# Patient Record
Sex: Male | Born: 1965 | Race: Black or African American | Hispanic: No
Health system: Southern US, Community
[De-identification: ages and names within clinical notes are randomized; demographics above are authoritative.]

---

## 2019-08-04 ENCOUNTER — Other Ambulatory Visit: Payer: Self-pay

## 2019-08-04 ENCOUNTER — Emergency Department (HOSPITAL_COMMUNITY)
Admission: EM | Admit: 2019-08-04 | Discharge: 2019-08-04 | Disposition: A | Discharge status: Home / Self Care | Payer: Medicaid Other | Attending: Emergency Medicine | Admitting: Emergency Medicine

## 2019-08-04 ENCOUNTER — Emergency Department (HOSPITAL_COMMUNITY): Payer: Medicaid Other

## 2019-08-04 ENCOUNTER — Encounter (HOSPITAL_COMMUNITY): Payer: Self-pay | Admitting: Emergency Medicine

## 2019-08-04 DIAGNOSIS — Z7984 Long term (current) use of oral hypoglycemic drugs: Secondary | ICD-10-CM | POA: Insufficient documentation

## 2019-08-04 DIAGNOSIS — I10 Essential (primary) hypertension: Secondary | ICD-10-CM | POA: Insufficient documentation

## 2019-08-04 DIAGNOSIS — Z79899 Other long term (current) drug therapy: Secondary | ICD-10-CM | POA: Insufficient documentation

## 2019-08-04 DIAGNOSIS — R42 Dizziness and giddiness: Secondary | ICD-10-CM

## 2019-08-04 DIAGNOSIS — R4789 Other speech disturbances: Secondary | ICD-10-CM | POA: Insufficient documentation

## 2019-08-04 DIAGNOSIS — R531 Weakness: Secondary | ICD-10-CM | POA: Insufficient documentation

## 2019-08-04 DIAGNOSIS — R519 Headache, unspecified: Secondary | ICD-10-CM | POA: Insufficient documentation

## 2019-08-04 DIAGNOSIS — E119 Type 2 diabetes mellitus without complications: Secondary | ICD-10-CM | POA: Insufficient documentation

## 2019-08-04 LAB — COMPREHENSIVE METABOLIC PANEL
ALT: 23 U/L (ref 0–44)
AST: 24 U/L (ref 15–41)
Albumin: 3.9 g/dL (ref 3.5–5.0)
Alkaline Phosphatase: 50 U/L (ref 38–126)
Anion gap: 8 (ref 5–15)
BUN: 10 mg/dL (ref 6–20)
CO2: 24 mmol/L (ref 22–32)
Calcium: 9.5 mg/dL (ref 8.9–10.3)
Chloride: 109 mmol/L (ref 98–111)
Creatinine, Ser: 0.8 mg/dL (ref 0.61–1.24)
GFR calc Af Amer: >60 mL/min (ref >60)
GFR calc non Af Amer: >60 mL/min (ref >60)
Glucose, Bld: 71 mg/dL (ref 70–99)
Potassium: 3.8 mmol/L (ref 3.5–5.1)
Sodium: 141 mmol/L (ref 135–145)
Total Bilirubin: 0.8 mg/dL (ref 0.3–1.2)
Total Protein: 7.7 g/dL (ref 6.5–8.1)

## 2019-08-04 LAB — CBC
HCT: 44.9 % (ref 39.0–52.0)
Hemoglobin: 15.3 g/dL (ref 13.0–17.0)
MCH: 32.8 pg (ref 26.0–34.0)
MCHC: 34.1 g/dL (ref 30.0–36.0)
MCV: 96.4 fL (ref 80.0–100.0)
Platelets: 284 10*3/uL (ref 150–400)
RBC: 4.66 MIL/uL (ref 4.22–5.81)
RDW: 12.4 % (ref 11.5–15.5)
WBC: 9.9 10*3/uL (ref 4.0–10.5)
nRBC: 0 % (ref 0.0–0.2)

## 2019-08-04 LAB — URINALYSIS, ROUTINE W REFLEX MICROSCOPIC
Bilirubin Urine: NEGATIVE
Glucose, UA: NEGATIVE mg/dL
Hgb urine dipstick: NEGATIVE
Ketones, ur: 5 mg/dL — AB
Leukocytes,Ua: NEGATIVE
Nitrite: NEGATIVE
Protein, ur: NEGATIVE mg/dL
Specific Gravity, Urine: 1.032 — ABNORMAL HIGH (ref 1.005–1.030)
pH: 5 (ref 5.0–8.0)

## 2019-08-04 LAB — DIFFERENTIAL
Abs Immature Granulocytes: 0.02 10*3/uL (ref 0.00–0.07)
Basophils Absolute: 0.1 10*3/uL (ref 0.0–0.1)
Basophils Relative: 1 %
Eosinophils Absolute: 0.2 10*3/uL (ref 0.0–0.5)
Eosinophils Relative: 2 %
Immature Granulocytes: 0 %
Lymphocytes Relative: 37 %
Lymphs Abs: 3.6 10*3/uL (ref 0.7–4.0)
Monocytes Absolute: 0.7 10*3/uL (ref 0.1–1.0)
Monocytes Relative: 7 %
Neutro Abs: 5.3 10*3/uL (ref 1.7–7.7)
Neutrophils Relative %: 53 %

## 2019-08-04 LAB — PROTIME-INR
INR: 1 (ref 0.8–1.2)
Prothrombin Time: 12.7 seconds (ref 11.4–15.2)

## 2019-08-04 LAB — I-STAT CHEM 8, ED
BUN: 10 mg/dL (ref 6–20)
Calcium, Ion: 1.08 mmol/L — ABNORMAL LOW (ref 1.15–1.40)
Chloride: 109 mmol/L (ref 98–111)
Creatinine, Ser: 0.8 mg/dL (ref 0.61–1.24)
Glucose, Bld: 68 mg/dL — ABNORMAL LOW (ref 70–99)
HCT: 46 % (ref 39.0–52.0)
Hemoglobin: 15.6 g/dL (ref 13.0–17.0)
Potassium: 3.8 mmol/L (ref 3.5–5.1)
Sodium: 140 mmol/L (ref 135–145)
TCO2: 22 mmol/L (ref 22–32)

## 2019-08-04 LAB — RAPID URINE DRUG SCREEN, HOSP PERFORMED
Amphetamines: NOT DETECTED
Barbiturates: NOT DETECTED
Benzodiazepines: NOT DETECTED
Cocaine: NOT DETECTED
Opiates: NOT DETECTED
Tetrahydrocannabinol: NOT DETECTED

## 2019-08-04 LAB — CBG MONITORING, ED: Glucose-Capillary: 114 mg/dL — ABNORMAL HIGH (ref 70–99)

## 2019-08-04 LAB — ETHANOL: Alcohol, Ethyl (B): <10 mg/dL (ref <10)

## 2019-08-04 LAB — APTT: aPTT: 32 seconds (ref 24–36)

## 2019-08-04 MED ORDER — KETOROLAC TROMETHAMINE 15 MG/ML IJ SOLN
15.0000 mg | Freq: Once | INTRAMUSCULAR | Status: AC
  Administered 2019-08-04: 16:00:00 15 mg via INTRAVENOUS
  Filled 2019-08-04: qty 1

## 2019-08-04 MED ORDER — SODIUM CHLORIDE 0.9 % IV BOLUS
1000.0000 mL | Freq: Once | INTRAVENOUS | Status: AC
  Administered 2019-08-04: 1000 mL via INTRAVENOUS

## 2019-08-04 MED ORDER — PROCHLORPERAZINE EDISYLATE 10 MG/2ML IJ SOLN
10.0000 mg | Freq: Once | INTRAMUSCULAR | Status: AC
  Administered 2019-08-04: 10 mg via INTRAVENOUS
  Filled 2019-08-04: qty 2

## 2019-08-04 MED ORDER — DIPHENHYDRAMINE HCL 50 MG/ML IJ SOLN
50.0000 mg | Freq: Once | INTRAMUSCULAR | Status: AC
  Administered 2019-08-04: 50 mg via INTRAVENOUS
  Filled 2019-08-04: qty 1

## 2019-08-04 NOTE — ED Provider Notes (Signed)
[facility]-EMERGENCY DEPT Provider Note   CSN: [id number] Arrival date & time: 08/04/19  1409     History Chief Complaint  Patient presents with  . Dizziness  . Headache    Chrishawn Boley is a 54 y.o. male who presents to the ED today with complaint of sudden onset lightheadedness with "shaking" that began this morning around 11:30 AM. Pt reports he was at work when this occurred; he attempted to put his head down on his desk and when he picked his head back up he had a sudden onset headache. Pt states that he was able to walk to his boss' office who made him sit down. He reports feeling like he was stuttering/slurring his words and felt heaviness on his left side. Pt states this lasted 20-30 minutes before dissipating. He reports he still has the headache. He reports he was not sure if his sugars were low; he took his medications this morning including his lisinopril and metformin however did not eat anything prior to the episode occurring. Someone from pt's sober house came to pick him up and brought him to the ED. Pt was able to walk from the office into the car and then again from the car into the ED and to his bedroom without difficulty. He reports a similar episode approximately 20 years ago for which he was evaluated in [place] however he is unsure if they found anything from the work up. 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   The history is provided by the patient and medical records.       History reviewed. No pertinent past medical history.  There are no problems to display for this patient.   History reviewed. No pertinent surgical history.     No family history on file.  Social History   Tobacco Use  . Smoking status: Not on file  Substance Use Topics  . Alcohol use: Not on file  . Drug use: Not on file    Home Medications Prior to Admission medications     Medication Sig Start Date End Date Taking? Authorizing Provider  lisinopril (ZESTRIL) 10 MG tablet Take 10 mg by mouth daily.   Yes [provider]  metFORMIN (GLUCOPHAGE) 500 MG tablet Take 500 mg by mouth 2 (two) times daily with a meal.   Yes [provider]    Allergies    Ibuprofen  Review of Systems   Review of Systems  Constitutional: Negative for chills and fever.  Respiratory: Negative for cough and shortness of breath.   Cardiovascular: Negative for chest pain.  Gastrointestinal: Negative for abdominal pain, nausea and vomiting.  Neurological: Positive for speech difficulty, weakness, light-headedness and headaches. Negative for syncope.  All other systems reviewed and are negative.   Physical Exam Updated Vital Signs BP 138/89   Pulse 88   Temp 98.5 F (36.9 C) (Oral)   Resp 17   SpO2 98%   Physical Exam Vitals and nursing note reviewed.  Constitutional:      Appearance: He is not ill-appearing or diaphoretic.  HENT:     Head: Normocephalic and atraumatic.  Eyes:     Conjunctiva/sclera: Conjunctivae normal.     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  Cardiovascular:     Rate and Rhythm: Normal rate and regular  rhythm.     Heart sounds: Normal heart sounds.  Pulmonary:     Effort: Pulmonary effort is normal.     Breath sounds: Normal breath sounds. No wheezing, rhonchi or rales.  Abdominal:     Palpations: Abdomen is soft.     Tenderness: There is no abdominal tenderness.  Musculoskeletal:     Cervical back: Neck supple.  Skin:    General: Skin is warm and dry.  Neurological:     Mental Status: He is alert.     GCS: GCS eye subscore is 4. GCS verbal subscore is 5. GCS motor subscore is 6.     Comments: CN 3-12 grossly intact A&O x4 GCS 15 Sensation and strength intact Coordination with finger-to-nose WNL Neg romberg, neg pronator drift     ED  Results / Procedures / Treatments   Labs (all labs ordered are listed, but only abnormal results are displayed) Labs Reviewed  URINALYSIS, ROUTINE W REFLEX MICROSCOPIC - Abnormal; Notable for the following components:      Result Value   Specific Gravity, Urine 1.032 (*)    Ketones, ur 5 (*)    All other components within normal limits  CBG MONITORING, ED - Abnormal; Notable for the following components:   Glucose-Capillary 114 (*)    All other components within normal limits  I-STAT CHEM 8, ED - Abnormal; Notable for the following components:   Glucose, Bld 68 (*)    Calcium, Ion 1.08 (*)    All other components within normal limits  ETHANOL  PROTIME-INR  APTT  CBC  DIFFERENTIAL  COMPREHENSIVE METABOLIC PANEL  RAPID URINE DRUG SCREEN, HOSP PERFORMED    EKG EKG Interpretation  Date/Time:  Wednesday August 04 2019 15:46:59 EDT Ventricular Rate:  72 PR Interval:    QRS Duration: 97 QT Interval:  413 QTC Calculation: 452 R Axis:   -19 Text Interpretation: Sinus rhythm Borderline left axis deviation Low voltage, precordial leads Probable anteroseptal infarct, old Confirmed by Virgina Norfolk [phone]) on 08/04/2019 3:51:55 PM   Radiology CT HEAD CODE STROKE WO CONTRAST  Result Date: 08/04/2019 CLINICAL DATA:  Code stroke. 54 year old male with dizziness and near syncope. Left leg weakness and numbness. Headache. EXAM: CT HEAD WITHOUT CONTRAST TECHNIQUE: Contiguous axial images were obtained from the base of the skull through the vertex without intravenous contrast. COMPARISON:  None. FINDINGS: Brain: No midline shift, ventriculomegaly, mass effect, evidence of mass lesion, intracranial hemorrhage or evidence of cortically based acute infarction. Scattered bilateral subcortical white matter hypodensity. Deep gray nuclei, brainstem and cerebellum are within normal limits. No cortical encephalomalacia identified. Vascular: Calcified atherosclerosis at the skull base. No suspicious  intracranial vascular hyperdensity. Skull: Small chronic left lamina papyracea fracture. No acute osseous abnormality identified. Sinuses/Orbits: Visualized paranasal sinuses and mastoids are clear. Other: Left globe prosthesis. Otherwise negative orbits soft tissues. Visualized scalp soft tissues are within normal limits. ASPECTS [facility] Stroke Program Early CT Score) Total score (0-10 with 10 being normal): 10 IMPRESSION: 1. No acute intracranial hemorrhage or cortically based infarct identified. ASPECTS 10. 2. Scattered bilateral cerebral white matter changes, most commonly due to small vessel disease. 3. Study discussed by telephone with PA Aimar Shrewsbury on 08/04/2019 at 15:47 . Electronically Signed   By: Odessa Fleming M.D.   On: 08/04/2019 15:47    Procedures Procedures (including critical care time)  Medications Ordered in ED Medications  diphenhydrAMINE (BENADRYL) injection 50 mg (50 mg Intravenous Given 08/04/19 1601)  prochlorperazine (COMPAZINE) injection 10 mg (10 mg  Intravenous Given 08/04/19 1601)  ketorolac (TORADOL) 15 MG/ML injection 15 mg (15 mg Intravenous Given 08/04/19 1601)  sodium chloride 0.9 % bolus 1,000 mL (0 mLs Intravenous Stopped 08/04/19 1835)    ED Course  I have reviewed the triage vital signs and the nursing notes.  Pertinent labs & imaging results that were available during my care of the patient were reviewed by me and considered in my medical decision making (see chart for details).    MDM Rules/Calculators/A&P                      54 year old male presenting to the ED today with CC of dizziness and headache that began around 11:30 AM this morning. Pt had been in the ED for 45 minutes prior to being seen; while in the room pt also reports left sided heaviness and stuttering/slurring speech with the headache however did not mention this during time of triage. He is unable to discern if he was stuttering vs slurring. He is not a great historian and left sided weakness  was only brought up with initiation by myself. He initially states that his left leg felt weak and like it was giving out on him and then reports his left arm also felt weak after prompting. He reports he still has the headache however it has gone down in severity since first presenting. Pt is no longer complaining of the heaviness on his left side. Per nursing staff they were told by pt that his left leg is always weak.   On arrival to the ED pt is afebrile, nontachycardic, and nontachypneic. He appears to be in NAD. He has no focal neuro deficits on exam currently. He has a left prosthetic eye but right eye with PERRL and EOMI. Strength and sensation equal to BUE and BLEs. Pt with hx of HTN and diabetes; does not check his sugars at home. CBG On arrival 114. Given story of sudden onset headache will obtain stat CT head. Will obtain labwork as well. Question symptoms related to possible SAH vs other etiology including possible hypoglycemia given pt reports feeling improved after he went home and ate something. Will continue to monitor in the ED. I do not suspect stroke at this time. NIH scale 0. Given headache proceeded other events I do not suspect TIA either. Pt is well appearing and if no acute findings on work up do not feel he needs admission.   CT Head without acute findings today. Does show some microvascular changes. Will treat headache at this time with headache cocktail and reevaluate.   EKG without acute ischemic changes CBC without leukocytosis. Hgb stable.  CMP without electrolyte abnormalities.   Upon reevaluation pt reports he is feeling improved in terms of his headache. He continues to have no focal neuro deficits on exam. Will ambulate for safety prior to discharge.   Pt ambulated without any assistance. He reports feeling improved and is ready to go home. Will discharge at this time.   This note was prepared using Dragon voice recognition software and may include unintentional  dictation errors due to the inherent limitations of voice recognition software.  Final Clinical Impression(s) / ED Diagnoses Final diagnoses:  Lightheadedness  Acute nonintractable headache, unspecified headache type    Rx / DC Orders ED Discharge Orders    None       Discharge Instructions     Your labwork and imaging were reassuring today Return to the ED IMMEDIATELY for  any worsening symptoms including worsening headache, passing out, vision changes, inability to move one side of your body, facial droop, speech changes, or any other concerning symptoms.        Tanda Rockers, PA-C 08/04/19 1931    Virgina Norfolk, DO 08/04/19 2203

## 2019-08-04 NOTE — ED Triage Notes (Signed)
Pt reports he was at work and felt faint and headache, hadnt had anything to eat or drink when symptoms came on. Reports after leaving work had food and drank a soda.

## 2019-08-04 NOTE — Discharge Instructions (Addendum)
Your labwork and imaging were reassuring today Return to the ED IMMEDIATELY for any worsening symptoms including worsening headache, passing out, vision changes, inability to move one side of your body, facial droop, speech changes, or any other concerning symptoms.

## 2019-08-04 NOTE — ED Notes (Signed)
Pt transported to CT ?

## 2019-08-04 NOTE — ED Notes (Signed)
Patient ambulated with no assistance.

## 2020-09-09 IMAGING — CT CT HEAD CODE STROKE
3 series · 15 of 47 positions shown, 18 images · non-contrast
Comparison: None.

CLINICAL DATA: Code stroke. 53-year-old male with dizziness and
near syncope. Left leg weakness and numbness. Headache.

EXAM:
CT HEAD WITHOUT CONTRAST
TECHNIQUE: Contiguous axial images were obtained from the base of the skull
through the vertex without intravenous contrast.

[Series 2: head wo · axial · 0.46mm/px · z∈[+1425,+1555]mm · 9 of 32 slices shown, 12 images]
[im 3/32  brain]
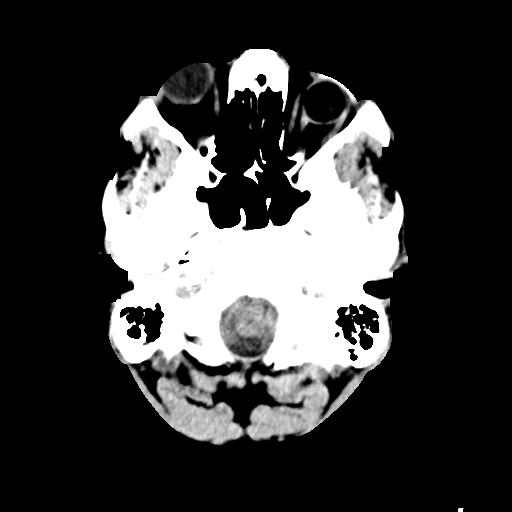
[im 3/32  bone]
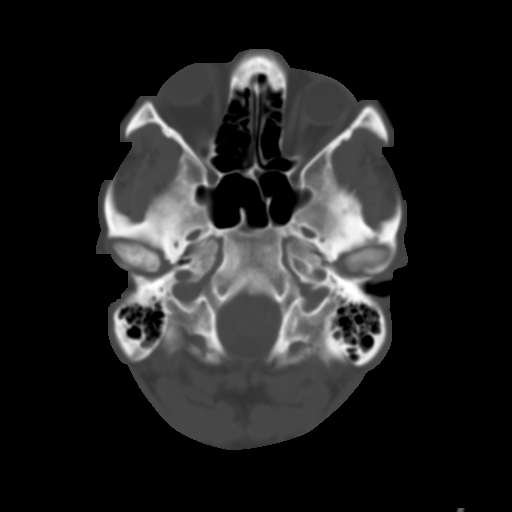
[im 6/32  brain]
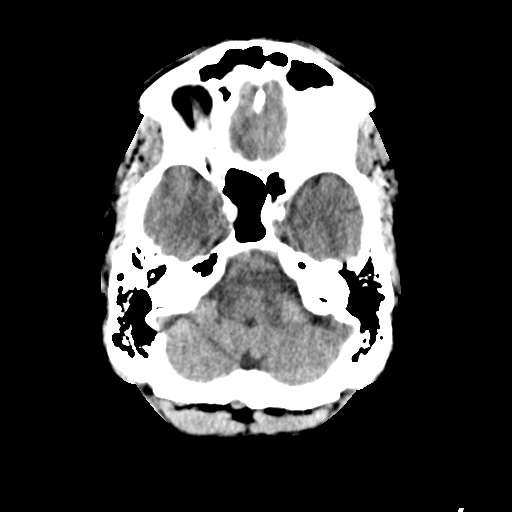
[im 9/32  brain]
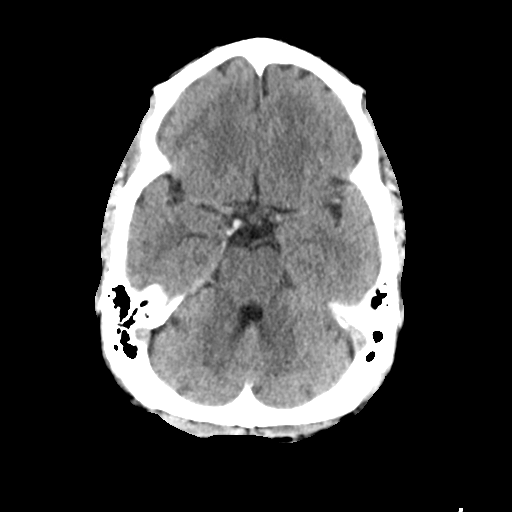
[im 12/32  brain]
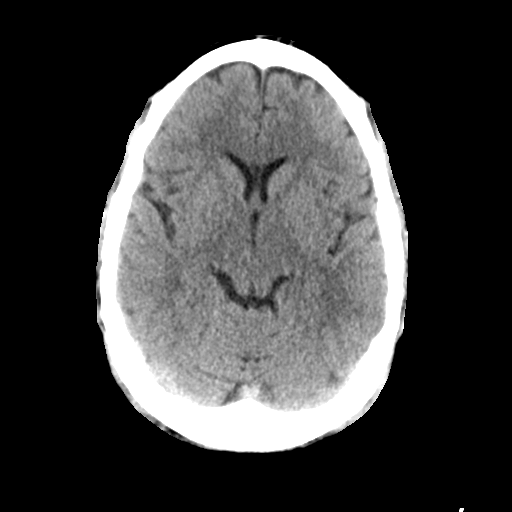
[im 17/32  brain]
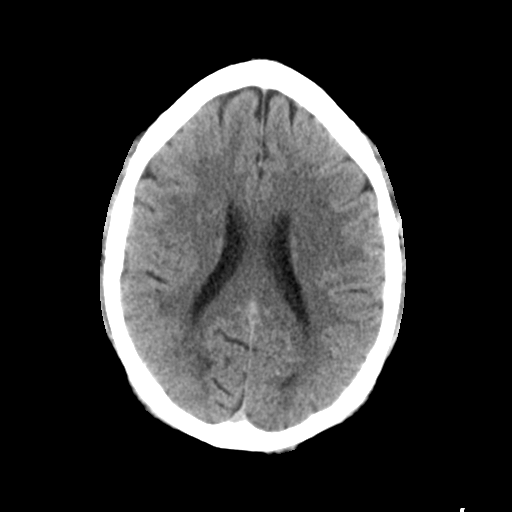
[im 17/32  bone]
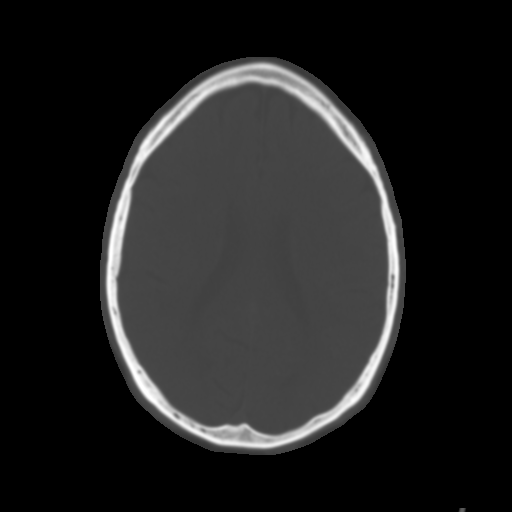
[im 20/32  brain]
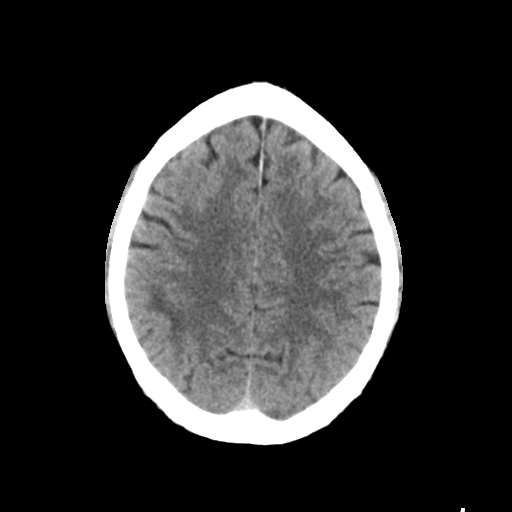
[im 23/32  brain]
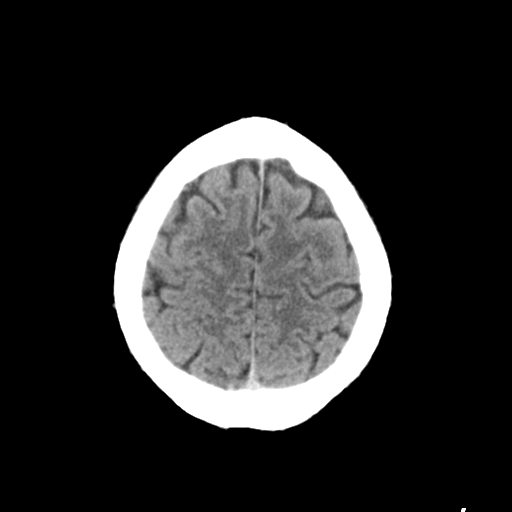
[im 26/32  brain]
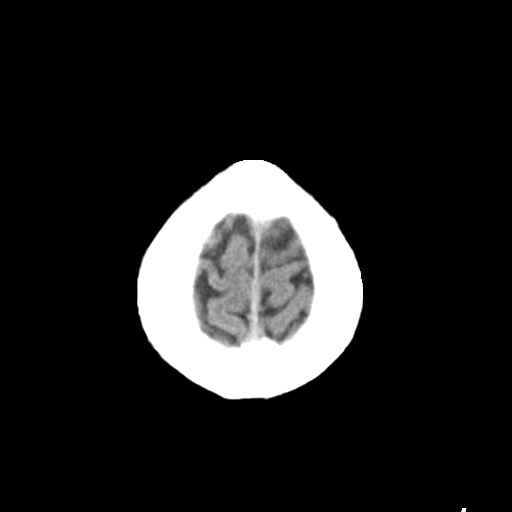
[im 29/32  brain]
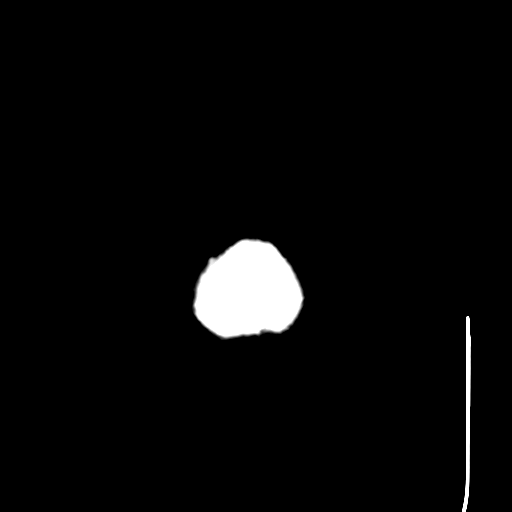
[im 29/32  bone]
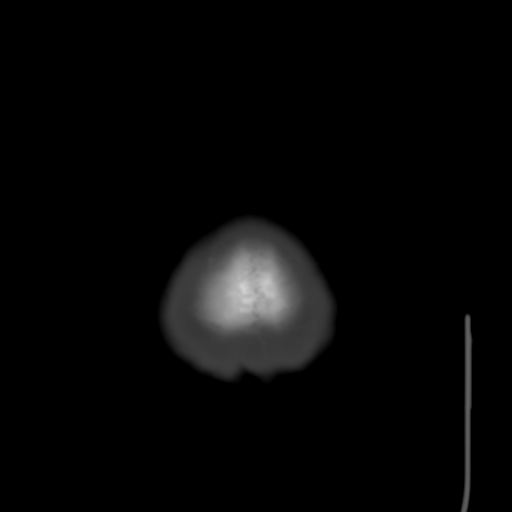

[Series 4: coronal soft tissue · coronal · 0.31mm/px · 3 of 70 slices shown]
[im 24/70  brain]
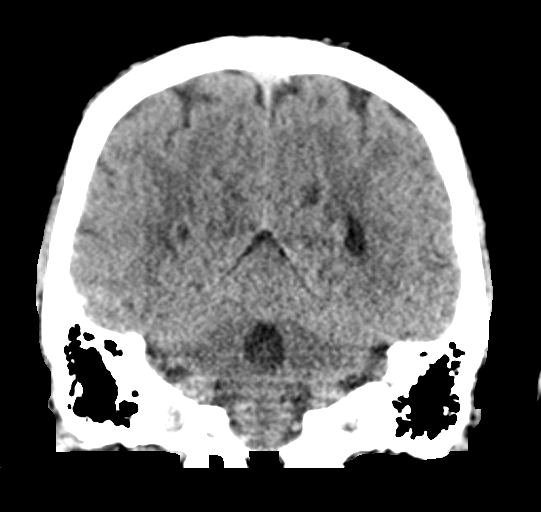
[im 31/70  brain]
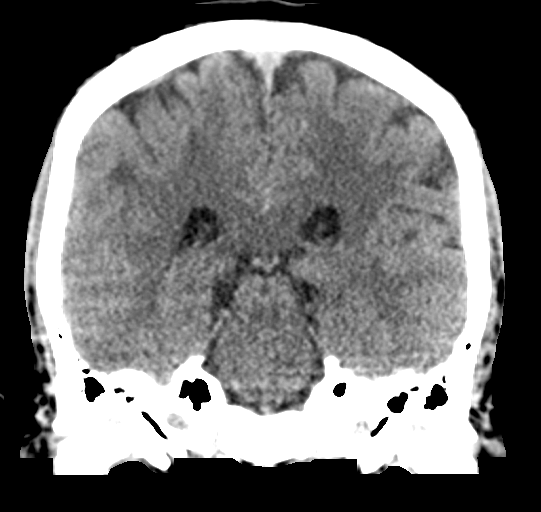
[im 39/70  brain]
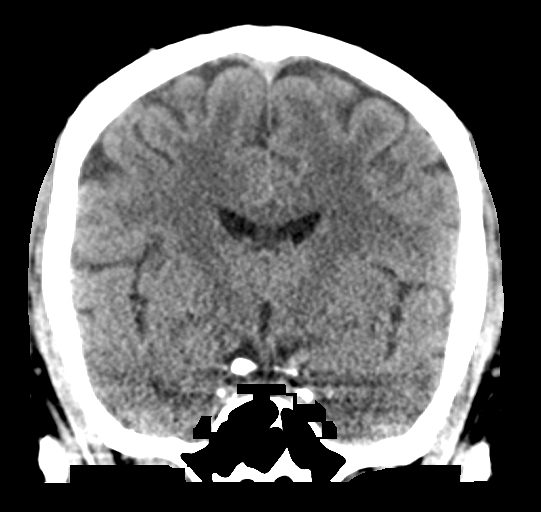

[Series 5: sagittal soft tissue · sagittal · 0.31mm/px · 3 of 56 slices shown]
[im 19/56  brain]
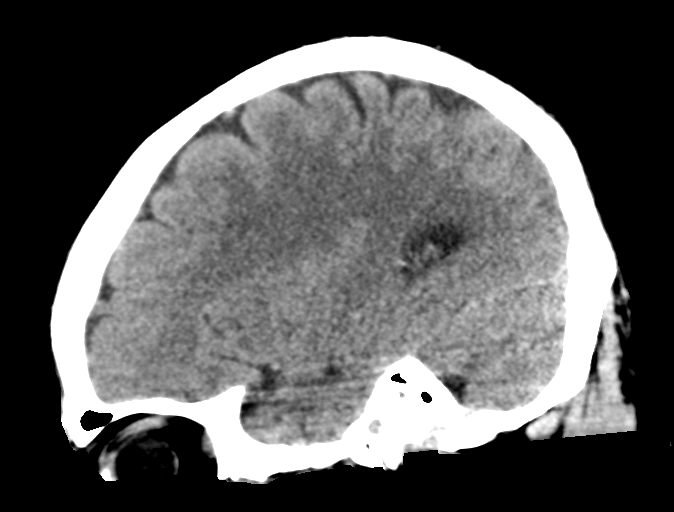
[im 28/56  brain]
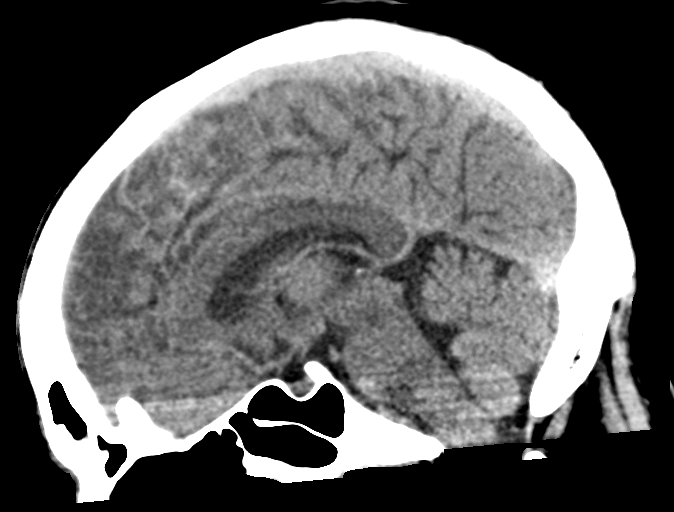
[im 37/56  brain]
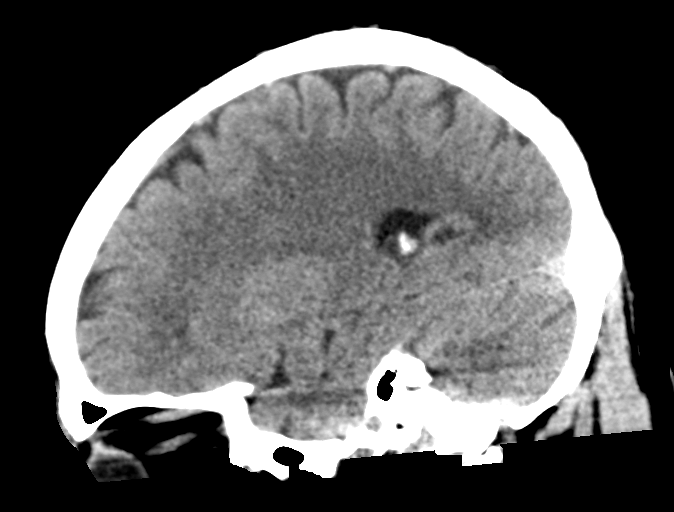

[15 of 47 positions shown; findings below may reference images not displayed]

FINDINGS: Brain: No midline shift, ventriculomegaly, mass effect, evidence of
mass lesion, intracranial hemorrhage or evidence of cortically based
acute infarction. Scattered bilateral subcortical white matter
hypodensity. Deep gray nuclei, brainstem and cerebellum are within
normal limits. No cortical encephalomalacia identified.

Vascular: Calcified atherosclerosis at the skull base. No suspicious
intracranial vascular hyperdensity.

Skull: Small chronic left lamina papyracea fracture. No acute
osseous abnormality identified.

Sinuses/Orbits: Visualized paranasal sinuses and mastoids are clear.

Other: Left globe prosthesis. Otherwise negative orbits soft
tissues. Visualized scalp soft tissues are within normal limits.

ASPECTS ([facility] Stroke Program Early CT Score)

Total score (0-10 with 10 being normal): 10
IMPRESSION: 1. No acute intracranial hemorrhage or cortically based infarct
identified. ASPECTS 10.
2. Scattered bilateral cerebral white matter changes, most commonly
due to small vessel disease.
3. Study discussed by telephone with PA VERINDER ROCK on 08/04/2019
